# Patient Record
Sex: Male | Born: 1965 | Hispanic: Yes | Marital: Married | Smoking: Never smoker
Health system: Southern US, Community
[De-identification: ages and names within clinical notes are randomized; demographics above are authoritative.]

---

## 2005-10-16 ENCOUNTER — Inpatient Hospital Stay: Payer: Self-pay | Admitting: Orthopaedic Surgery

## 2008-08-09 ENCOUNTER — Emergency Department: Payer: Self-pay | Admitting: Emergency Medicine

## 2011-01-14 ENCOUNTER — Emergency Department: Payer: Self-pay | Admitting: Emergency Medicine

## 2012-11-12 ENCOUNTER — Emergency Department: Payer: Self-pay | Admitting: Emergency Medicine

## 2012-11-12 LAB — TROPONIN I
Troponin-I: <0.02 ng/mL
Troponin-I: <0.02 ng/mL

## 2012-11-12 LAB — BASIC METABOLIC PANEL
Anion Gap: 8 (ref 7–16)
BUN: 17 mg/dL (ref 7–18)
Calcium, Total: 9.4 mg/dL (ref 8.5–10.1)
Co2: 25 mmol/L (ref 21–32)
Creatinine: 0.95 mg/dL (ref 0.60–1.30)
EGFR (African American): >60
EGFR (Non-African Amer.): >60
Glucose: 136 mg/dL — ABNORMAL HIGH (ref 65–99)
Osmolality: 276 (ref 275–301)
Potassium: 3.5 mmol/L (ref 3.5–5.1)

## 2012-11-12 LAB — CBC
HCT: 49 % (ref 40.0–52.0)
HGB: 16.8 g/dL (ref 13.0–18.0)
MCH: 31.5 pg (ref 26.0–34.0)
MCHC: 34.3 g/dL (ref 32.0–36.0)
MCV: 92 fL (ref 80–100)
RDW: 13.3 % (ref 11.5–14.5)
WBC: 9 10*3/uL (ref 3.8–10.6)

## 2012-11-12 LAB — CK TOTAL AND CKMB (NOT AT ARMC): CK, Total: 48 U/L (ref 35–232)

## 2018-03-05 ENCOUNTER — Encounter: Payer: Self-pay | Admitting: Emergency Medicine

## 2018-03-05 ENCOUNTER — Other Ambulatory Visit: Payer: Self-pay

## 2018-03-05 ENCOUNTER — Emergency Department
Admission: EM | Admit: 2018-03-05 | Discharge: 2018-03-05 | Disposition: A | Discharge status: Home / Self Care | Payer: Self-pay | Attending: Student in an Organized Health Care Education/Training Program | Admitting: Student in an Organized Health Care Education/Training Program

## 2018-03-05 DIAGNOSIS — Z23 Encounter for immunization: Secondary | ICD-10-CM | POA: Insufficient documentation

## 2018-03-05 DIAGNOSIS — W260XXA Contact with knife, initial encounter: Secondary | ICD-10-CM | POA: Insufficient documentation

## 2018-03-05 DIAGNOSIS — S61311A Laceration without foreign body of left index finger with damage to nail, initial encounter: Secondary | ICD-10-CM | POA: Insufficient documentation

## 2018-03-05 DIAGNOSIS — Y998 Other external cause status: Secondary | ICD-10-CM | POA: Insufficient documentation

## 2018-03-05 DIAGNOSIS — Y9389 Activity, other specified: Secondary | ICD-10-CM | POA: Insufficient documentation

## 2018-03-05 DIAGNOSIS — Y929 Unspecified place or not applicable: Secondary | ICD-10-CM | POA: Insufficient documentation

## 2018-03-05 MED ORDER — LIDOCAINE HCL 1 % IJ SOLN
5.0000 mL | Freq: Once | INTRAMUSCULAR | Status: AC
  Administered 2018-03-05: 5 mL
  Filled 2018-03-05: qty 5

## 2018-03-05 MED ORDER — TETANUS-DIPHTH-ACELL PERTUSSIS 5-2.5-18.5 LF-MCG/0.5 IM SUSP
0.5000 mL | Freq: Once | INTRAMUSCULAR | Status: AC
  Administered 2018-03-05: 0.5 mL via INTRAMUSCULAR
  Filled 2018-03-05: qty 0.5

## 2018-03-05 MED ORDER — CEPHALEXIN 500 MG PO CAPS: 500.0000 mg | ORAL_CAPSULE | Freq: Three times a day (TID) | ORAL | 0 refills | Status: AC

## 2018-03-05 MED ORDER — LIDOCAINE HCL (PF) 1 % IJ SOLN
INTRAMUSCULAR | Status: AC
  Administered 2018-03-05: 5 mL
  Filled 2018-03-05: qty 5

## 2018-03-05 NOTE — ED Provider Notes (Signed)
[facility] Emergency Department Provider Note  ____________________________________________  Time seen: Approximately 5:28 PM  I have reviewed the triage vital signs and the nursing notes.   HISTORY  Chief Complaint Finger Injury    HPI Devin Phelps is a 52 y.o. male presents to the emergency department with a left index finger laceration.  Patient reports that he sustained laceration with clean box knife while cutting flooring.  He denies weakness, radiculopathy or changes in sensation of the upper extremities.  His tetanus status is out of date.  No alleviating measures have been attempted.   History reviewed. No pertinent past medical history.  There are no active problems to display for this patient.   History reviewed. No pertinent surgical history.  Prior to Admission medications   Medication Sig Start Date End Date Taking? Authorizing Provider  cephALEXin (KEFLEX) 500 MG capsule Take 1 capsule (500 mg total) by mouth 3 (three) times daily for 10 days. 03/05/18 03/15/18  Orvil Feil, PA-C    Allergies Patient has no known allergies.  No family history on file.  Social History Social History   Tobacco Use  . Smoking status: Never Smoker  . Smokeless tobacco: Never Used  Substance Use Topics  . Alcohol use: Not on file  . Drug use: Not on file     Review of Systems  Constitutional: No fever/chills Eyes: No visual changes. No discharge ENT: No upper respiratory complaints. Cardiovascular: no chest pain. Respiratory: no cough. No SOB. Gastrointestinal: No abdominal pain.  No nausea, no vomiting.  No diarrhea.  No constipation. Musculoskeletal: Negative for musculoskeletal pain. Skin: Patient has left index finger laceration.  Neurological: Negative for headaches, focal weakness or numbness.  ____________________________________________   PHYSICAL EXAM:  VITAL SIGNS: ED Triage Vitals  Enc Vitals Group     BP  03/05/18 1622 124/79     Pulse Rate 03/05/18 1622 82     Resp 03/05/18 1622 17     Temp 03/05/18 1622 98.5 F (36.9 C)     Temp Source 03/05/18 1622 Oral     SpO2 03/05/18 1622 98 %     Weight 03/05/18 1558 254 lb (115.2 kg)     Height 03/05/18 1558 5\' 10"  (1.778 m)     Head Circumference --      Peak Flow --      Pain Score 03/05/18 1600 0     Pain Loc --      Pain Edu? --      Excl. in GC? --      Constitutional: Alert and oriented. Well appearing and in no acute distress. Eyes: Conjunctivae are normal. PERRL. EOMI. Head: Atraumatic. Cardiovascular: Normal rate, regular rhythm. Normal S1 and S2.  Good peripheral circulation. Respiratory: Normal respiratory effort without tachypnea or retractions. Lungs CTAB. Good air entry to the bases with no decreased or absent breath sounds. Musculoskeletal: Full range of motion to all extremities. No gross deformities appreciated. Neurologic:  Normal speech and language. No gross focal neurologic deficits are appreciated.  Skin: Patient has laceration extending from lateral aspect of left index finger nailbed to tip of finger.  Palpable radial pulse, left. Psychiatric: Mood and affect are normal. Speech and behavior are normal. Patient exhibits appropriate insight and judgement.   ____________________________________________   LABS (all labs ordered are listed, but only abnormal results are displayed)  Labs Reviewed - No data to display ____________________________________________  EKG   ____________________________________________  RADIOLOGY   No results found.  ____________________________________________    PROCEDURES  Procedure(s) performed:    Procedures  LACERATION REPAIR Performed by: Orvil FeilJaclyn M Traylen Eckels Authorized by: Orvil FeilJaclyn M Eston Heslin Consent: Verbal consent obtained. Risks and benefits: risks, benefits and alternatives were discussed Consent given by: patient Patient identity confirmed: provided demographic  data Prepped and Draped in normal sterile fashion Wound explored  Laceration Location: Left index finger   Laceration Length: 1.5 cm  No Foreign Bodies seen or palpated  Anesthesia: Digital block  Local anesthetic: lidocaine 1% without epinephrine  Anesthetic total: 5 ml  Irrigation method: syringe Amount of cleaning: standard  Skin closure: 5-0 Ethilon   Number of sutures: 5  Technique: Simple Interrupted   Patient tolerance: Patient tolerated the procedure well with no immediate complications.   Medications  lidocaine (XYLOCAINE) 1 % (with pres) injection 5 mL (has no administration in time range)  Tdap (BOOSTRIX) injection 0.5 mL (0.5 mLs Intramuscular Given 03/05/18 1726)     ____________________________________________   INITIAL IMPRESSION / ASSESSMENT AND PLAN / ED COURSE  Pertinent labs & imaging results that were available during my care of the patient were reviewed by me and considered in my medical decision making (see chart for details).  Review of the [place] CSRS was performed in accordance of the [facility] prior to dispensing any controlled drugs.      Assessment and plan Laceration Patient presents to the emergency department with a left index finger laceration repaired in the emergency department without complication.  Patient was advised to have sutures removed by primary care in 7 days.  His tetanus status was updated in the emergency department and he was discharged with Keflex.  Vital signs are reassuring prior to discharge.  All patient questions were answered.    ____________________________________________  FINAL CLINICAL IMPRESSION(S) / ED DIAGNOSES  Final diagnoses:  Laceration of left index finger without foreign body with damage to nail, initial encounter      NEW MEDICATIONS STARTED DURING THIS VISIT:  ED Discharge Orders        Ordered    cephALEXin (KEFLEX) 500 MG capsule  3 times daily     03/05/18 1717          This chart  was dictated using voice recognition software/Dragon. Despite best efforts to proofread, errors can occur which can change the meaning. Any change was purely unintentional.    Orvil FeilWoods, Sonjia Wilcoxson M, PA-C 03/05/18 1733    Willy Eddyobinson, Patrick, MD 03/05/18 [phone]

## 2018-03-05 NOTE — ED Notes (Addendum)
See triage note  States he cut his left index finger  States he was using a knife to cut flooring  Small laceration noted to tip of finger near nail bed

## 2018-03-05 NOTE — ED Triage Notes (Signed)
Left index finger laceration.  States cut finger with knife while cutting linoleum.  Bleeding controlled.

## 2019-05-22 ENCOUNTER — Ambulatory Visit
Admission: RE | Admit: 2019-05-22 | Discharge: 2019-05-22 | Disposition: A | Discharge status: Home / Self Care | Payer: Self-pay | Source: Ambulatory Visit | Attending: Family Medicine | Admitting: Family Medicine

## 2019-05-22 ENCOUNTER — Other Ambulatory Visit: Payer: Self-pay | Admitting: Family Medicine

## 2019-05-22 ENCOUNTER — Ambulatory Visit (LOCAL_COMMUNITY_HEALTH_CENTER): Payer: Self-pay

## 2019-05-22 DIAGNOSIS — Z111 Encounter for screening for respiratory tuberculosis: Secondary | ICD-10-CM

## 2019-05-22 DIAGNOSIS — R7612 Nonspecific reaction to cell mediated immunity measurement of gamma interferon antigen response without active tuberculosis: Secondary | ICD-10-CM

## 2019-05-22 NOTE — Progress Notes (Signed)
Vitals not taken d/t interview taken outside (covid and TB sx's). Wife in clinic today as TB suspect. Patient reports night sweats x 1 night, cough x 1 month , weight loss of 30lbs. Patient reports also that he tested negative 03/2019 for covid. Patient and wife tested for covid today.QFT, TB baseline labs and CXR completed today also. Reports was planning to leave for [place] on 05/28/19; patient informed he cannot travel at this time d/t pending CXR and labs including pending sputums. Aileen Fass, RN

## 2019-05-23 ENCOUNTER — Other Ambulatory Visit (LOCAL_COMMUNITY_HEALTH_CENTER): Payer: Self-pay

## 2019-05-23 DIAGNOSIS — Z111 Encounter for screening for respiratory tuberculosis: Secondary | ICD-10-CM

## 2019-05-23 NOTE — Progress Notes (Signed)
Patient dropped off sputums x 3. TB RN to f/u with patient once AFB results are in. Aileen Fass, RN

## 2019-05-24 ENCOUNTER — Other Ambulatory Visit: Payer: Self-pay

## 2019-05-25 LAB — HEPATIC FUNCTION PANEL
ALT: 17 IU/L (ref 0–44)
AST: 10 IU/L (ref 0–40)
Albumin: 4.6 g/dL (ref 4.0–5.0)
Alkaline Phosphatase: 81 IU/L (ref 39–117)
Bilirubin Total: 0.3 mg/dL (ref 0.0–1.2)
Bilirubin, Direct: 0.09 mg/dL (ref 0.00–0.40)
Total Protein: 7.2 g/dL (ref 6.0–8.5)

## 2019-05-25 LAB — QUANTIFERON-TB GOLD PLUS
QuantiFERON Mitogen Value: 8.76 IU/mL
QuantiFERON Nil Value: 0.27 IU/mL
QuantiFERON TB1 Ag Value: 1.31 IU/mL
QuantiFERON TB2 Ag Value: 1.72 IU/mL
QuantiFERON-TB Gold Plus: POSITIVE — AB

## 2019-05-25 LAB — CBC WITH DIFFERENTIAL/PLATELET
Basophils Absolute: 0 10*3/uL (ref 0.0–0.2)
Basos: 1 %
EOS (ABSOLUTE): 0.1 10*3/uL (ref 0.0–0.4)
Eos: 1 %
Hematocrit: 42.3 % (ref 37.5–51.0)
Hemoglobin: 14.5 g/dL (ref 13.0–17.7)
Immature Grans (Abs): 0 10*3/uL (ref 0.0–0.1)
Immature Granulocytes: 0 %
Lymphocytes Absolute: 2.2 10*3/uL (ref 0.7–3.1)
Lymphs: 33 %
MCH: 31 pg (ref 26.6–33.0)
MCHC: 34.3 g/dL (ref 31.5–35.7)
MCV: 90 fL (ref 79–97)
Monocytes Absolute: 0.4 10*3/uL (ref 0.1–0.9)
Monocytes: 6 %
Neutrophils Absolute: 4 10*3/uL (ref 1.4–7.0)
Neutrophils: 59 %
Platelets: 276 10*3/uL (ref 150–450)
RBC: 4.68 x10E6/uL (ref 4.14–5.80)
RDW: 13 % (ref 11.6–15.4)
WBC: 6.7 10*3/uL (ref 3.4–10.8)

## 2019-05-25 LAB — RPR

## 2019-05-25 LAB — CREATININE, SERUM
Creatinine, Ser: 0.79 mg/dL (ref 0.76–1.27)
GFR calc Af Amer: 121 mL/min/{1.73_m2} (ref >59)
GFR calc non Af Amer: 105 mL/min/{1.73_m2} (ref >59)

## 2019-05-25 LAB — HIV ANTIBODY (ROUTINE TESTING W REFLEX)

## 2019-05-28 ENCOUNTER — Telehealth: Payer: Self-pay

## 2019-05-28 NOTE — Telephone Encounter (Signed)
TC with patient. Discussed normal CXR and blood work normal with the exception of +Quantiferon TB Gold. Also dicussed negative AFB smears x 3 and that cultures typically take 4-6 weeks. TB RN consulted with Dr. Ernestina Patches- she recommends LTBI tx at this time.  Patient will call TB RN once he returns from his trip to [place] to see his mom that is ill. Patient is interested in proceeding with LTBI tx. Aileen Fass, RN

## 2019-06-20 NOTE — Addendum Note (Signed)
Addended by: Aileen Fass on: 06/20/2019 03:23 PM   Modules accepted: Orders

## 2019-07-15 ENCOUNTER — Telehealth: Payer: Self-pay

## 2019-07-15 NOTE — Telephone Encounter (Signed)
TC to patient re: negative sputums. Discussed LTBI tx. Patient to f/u with TB RN if he chooses to proceed. Aileen Fass, RN

## 2019-12-11 ENCOUNTER — Ambulatory Visit: Payer: Self-pay | Attending: Internal Medicine

## 2019-12-11 DIAGNOSIS — Z23 Encounter for immunization: Secondary | ICD-10-CM

## 2019-12-11 NOTE — Progress Notes (Signed)
   Covid-19 Vaccination Clinic  Name:  Devin Phelps    MRN: [id number] DOB: 08-23-1966  12/11/2019  Mr. Devin Phelps was observed post Covid-19 immunization for 15 minutes without incident. He was provided with Vaccine Information Sheet and instruction to access the V-Safe system.   Mr. Devin Phelps was instructed to call 911 with any severe reactions post vaccine: Marland Kitchen Difficulty breathing  . Swelling of face and throat  . A fast heartbeat  . A bad rash all over body  . Dizziness and weakness   Immunizations Administered    Name Date Dose VIS Date Route   Pfizer COVID-19 Vaccine 12/11/2019 10:44 AM 0.3 mL 08/23/2019 Intramuscular   Manufacturer: [employer], [employer]   Lot: [id number]   NDC: [address]-9

## 2020-10-15 ENCOUNTER — Other Ambulatory Visit: Payer: Self-pay

## 2020-10-15 DIAGNOSIS — Z20822 Contact with and (suspected) exposure to covid-19: Secondary | ICD-10-CM

## 2020-10-16 LAB — NOVEL CORONAVIRUS, NAA: SARS-CoV-2, NAA: NOT DETECTED

## 2020-10-16 LAB — SARS-COV-2, NAA 2 DAY TAT
